# Patient Record
Sex: Female | Born: 2000 | Hispanic: No | Marital: Single
Health system: Northeastern US, Academic
[De-identification: ages and names within clinical notes are randomized; demographics above are authoritative.]

---
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
## 2023-06-15 ENCOUNTER — Inpatient Hospital Stay: Admit: 2023-06-15 | Discharge: 2023-06-15 | Payer: Medicaid (Managed Care) | Primary: Pediatrics

## 2023-06-15 ENCOUNTER — Encounter
Admit: 2023-06-15 | Payer: PRIVATE HEALTH INSURANCE | Attending: Vascular and Interventional Radiology | Primary: Pediatrics

## 2023-06-15 DIAGNOSIS — Z5181 Encounter for therapeutic drug level monitoring: Secondary | ICD-10-CM

## 2023-06-15 DIAGNOSIS — F32A Fatigue due to depression: Secondary | ICD-10-CM

## 2023-06-15 DIAGNOSIS — R5383 Other fatigue: Secondary | ICD-10-CM

## 2023-06-15 DIAGNOSIS — G1111 Friedreich's ataxia (HC CODE) (HC Code): Secondary | ICD-10-CM

## 2023-06-15 LAB — HEPATIC FUNCTION PANEL
BKR A/G RATIO: 0.9
BKR ALANINE AMINOTRANSFERASE (ALT): 32 U/L (ref 12–78)
BKR ALBUMIN: 3.4 g/dL (ref 3.4–5.0)
BKR ALKALINE PHOSPHATASE: 65 U/L (ref 20–120)
BKR ASPARTATE AMINOTRANSFERASE (AST): 18 U/L (ref 5–37)
BKR AST/ALT RATIO: 0.6
BKR BILIRUBIN DIRECT: <0.1 mg/dL (ref 0.0–0.3)
BKR BILIRUBIN TOTAL: 0.2 mg/dL (ref 0.0–1.0)
BKR BILIRUBIN, INDIRECT: >0.1 mg/dL
BKR GLOBULIN: 3.9 g/dL
BKR PROTEIN TOTAL: 7.3 g/dL (ref 6.4–8.2)

## 2023-06-16 DIAGNOSIS — G1111 Friedreich ataxia: Secondary | ICD-10-CM
